# Patient Record
Sex: Female | Born: 1990 | Race: White | Hispanic: No | Marital: Single
Health system: Southern US, Community
[De-identification: ages and names within clinical notes are randomized; demographics above are authoritative.]

---

## 2019-07-06 ENCOUNTER — Other Ambulatory Visit: Payer: Self-pay

## 2019-07-06 DIAGNOSIS — Z20822 Contact with and (suspected) exposure to covid-19: Secondary | ICD-10-CM

## 2019-07-08 LAB — NOVEL CORONAVIRUS, NAA: SARS-CoV-2, NAA: NOT DETECTED

## 2019-07-14 ENCOUNTER — Emergency Department (HOSPITAL_COMMUNITY)
Admission: EM | Admit: 2019-07-14 | Discharge: 2019-07-14 | Disposition: A | Discharge status: Home / Self Care | Payer: Managed Care, Other (non HMO) | Attending: Emergency Medicine | Admitting: Emergency Medicine

## 2019-07-14 ENCOUNTER — Emergency Department (HOSPITAL_COMMUNITY): Payer: Managed Care, Other (non HMO)

## 2019-07-14 ENCOUNTER — Other Ambulatory Visit: Payer: Self-pay

## 2019-07-14 ENCOUNTER — Encounter (HOSPITAL_COMMUNITY): Payer: Self-pay | Admitting: Emergency Medicine

## 2019-07-14 DIAGNOSIS — R109 Unspecified abdominal pain: Secondary | ICD-10-CM

## 2019-07-14 DIAGNOSIS — R0781 Pleurodynia: Secondary | ICD-10-CM

## 2019-07-14 DIAGNOSIS — Y9389 Activity, other specified: Secondary | ICD-10-CM | POA: Insufficient documentation

## 2019-07-14 DIAGNOSIS — S20411A Abrasion of right back wall of thorax, initial encounter: Secondary | ICD-10-CM | POA: Insufficient documentation

## 2019-07-14 DIAGNOSIS — R1031 Right lower quadrant pain: Secondary | ICD-10-CM | POA: Insufficient documentation

## 2019-07-14 DIAGNOSIS — W01198A Fall on same level from slipping, tripping and stumbling with subsequent striking against other object, initial encounter: Secondary | ICD-10-CM | POA: Insufficient documentation

## 2019-07-14 DIAGNOSIS — Y929 Unspecified place or not applicable: Secondary | ICD-10-CM | POA: Insufficient documentation

## 2019-07-14 DIAGNOSIS — Y998 Other external cause status: Secondary | ICD-10-CM | POA: Diagnosis not present

## 2019-07-14 DIAGNOSIS — S299XXA Unspecified injury of thorax, initial encounter: Secondary | ICD-10-CM | POA: Diagnosis present

## 2019-07-14 DIAGNOSIS — T148XXA Other injury of unspecified body region, initial encounter: Secondary | ICD-10-CM

## 2019-07-14 LAB — URINALYSIS, ROUTINE W REFLEX MICROSCOPIC
Bilirubin Urine: NEGATIVE
Glucose, UA: NEGATIVE mg/dL
Ketones, ur: NEGATIVE mg/dL
Leukocytes,Ua: NEGATIVE
Nitrite: NEGATIVE
Protein, ur: 30 mg/dL — AB
Specific Gravity, Urine: 1.023 (ref 1.005–1.030)
pH: 6 (ref 5.0–8.0)

## 2019-07-14 MED ORDER — METHOCARBAMOL 500 MG PO TABS: 500.0000 mg | ORAL_TABLET | Freq: Two times a day (BID) | ORAL | 0 refills | Status: AC

## 2019-07-14 MED ORDER — IBUPROFEN 400 MG PO TABS
600.0000 mg | ORAL_TABLET | Freq: Once | ORAL | Status: AC
  Administered 2019-07-14: 600 mg via ORAL
  Filled 2019-07-14: qty 1

## 2019-07-14 NOTE — Discharge Instructions (Signed)
Your x-rays are reassuring and show no rib fractures. Take ibuprofen and tylenol every 6 hours, apply ice for the next 2 days, and then alternate ice and heat. Take muscle relaxer as needed, this can cause drowsiness.  Keep abrasion clean and covered.  If pain is worsening or you develop blood in your urine, shortness of breath or any other new or concerning symptoms please return to the ED for reevaluation.

## 2019-07-14 NOTE — ED Provider Notes (Signed)
[facility] Provider Note   CSN: [id number] Arrival date & time: 07/14/19  1928     History   Chief Complaint Chief Complaint  Patient presents with  . Rib Injury    HPI Cynthia Erickson is a 28 y.o. female.     Cynthia Erickson is a 28 y.o. female who is otherwise healthy, presents for evaluation of right great toe injury.  She reports that her dog ran up behind her and jumped onto her legs causing her to fall onto her right side landing on a concrete step.  She has a large abrasion to the right lower lateral ribs and complains and reports pain that is worse with movement and deep breaths.  She has not had anything for pain prior to arrival.  She denies associated shortness of breath.  Has not noted any hematuria.  No anterior abdominal pain.  She did not hit her head, no neck or back pain.  No other injuries from fall.  Tetanus up-to-date.  No other aggravating or alleviating factors.     History reviewed. No pertinent past medical history.  There are no active problems to display for this patient.   History reviewed. No pertinent surgical history.   OB History   No obstetric history on file.      Home Medications    Prior to Admission medications   Not on File    Family History No family history on file.  Social History Social History   Tobacco Use  . Smoking status: Not on file  Substance Use Topics  . Alcohol use: Not on file  . Drug use: Not on file     Allergies   Sulfa antibiotics and Sulfur   Review of Systems Review of Systems  Constitutional: Negative for chills and fever.  HENT: Negative.   Respiratory: Negative for cough and shortness of breath.   Cardiovascular: Positive for chest pain (R lateral rib pain).  Gastrointestinal: Negative for abdominal pain, nausea and vomiting.  Genitourinary: Positive for flank pain. Negative for dysuria and hematuria.  Musculoskeletal: Positive for back pain.  Skin:  Positive for wound. Negative for color change.  Neurological: Negative for dizziness, syncope and light-headedness.     Physical Exam Updated Vital Signs BP (!) 133/92 (BP Location: Left Arm)   Pulse 83   Temp 98.1 F (36.7 C) (Oral)   Resp 14   LMP 07/14/2019 (Exact Date)   SpO2 97%   Physical Exam Vitals signs and nursing note reviewed.  Constitutional:      General: She is not in acute distress.    Appearance: Normal appearance. She is well-developed and normal weight. She is not diaphoretic.  HENT:     Head: Normocephalic and atraumatic.     Comments: No scalp tenderness or deformity Eyes:     General:        Right eye: No discharge.        Left eye: No discharge.     Pupils: Pupils are equal, round, and reactive to light.  Neck:     Musculoskeletal: Neck supple.  Cardiovascular:     Rate and Rhythm: Normal rate and regular rhythm.     Heart sounds: Normal heart sounds. No murmur. No friction rub. No gallop.   Pulmonary:     Effort: Pulmonary effort is normal. No respiratory distress.     Breath sounds: Normal breath sounds. No wheezing or rales.     Comments: Tenderness over the right lateral  ribs with large superficial abrasion to the right posterior lateral ribs, no palpable deformity or crepitus, breath sounds present throughout, lungs clear to auscultation Chest:     Chest wall: Tenderness present.  Abdominal:     General: Bowel sounds are normal. There is no distension.     Palpations: Abdomen is soft. There is no mass.     Tenderness: There is no abdominal tenderness. There is no guarding.     Comments: Abdomen soft and nondistended with no anterior abdominal tenderness, no focal right upper quadrant tenderness, patient does have some tenderness over the right flank but with no ecchymosis or deformity, and palpable overlying muscle spasm.  Musculoskeletal:        General: No deformity.     Comments: Tenderness over the right mid and low back musculature, no  midline spinal tenderness, no palpable deformity All joints supple and easily movable, although compartments soft.  Skin:    General: Skin is warm and dry.     Capillary Refill: Capillary refill takes less than 2 seconds.  Neurological:     Mental Status: She is alert.     Coordination: Coordination normal.     Comments: Speech is clear, able to follow commands Moves extremities without ataxia, coordination intact  Psychiatric:        Mood and Affect: Mood normal.        Behavior: Behavior normal.      ED Treatments / Results  Labs (all labs ordered are listed, but only abnormal results are displayed) Labs Reviewed - No data to display  EKG None  Radiology Dg Ribs Unilateral W/chest Right  Result Date: 07/14/2019 CLINICAL DATA:  Posterior lower RIGHT rib pain after falling today onto concrete EXAM: RIGHT RIBS AND CHEST - 3+ VIEW COMPARISON:  None FINDINGS: Normal heart size, mediastinal contours, and pulmonary vascularity. Lungs clear. No pulmonary infiltrate, pleural effusion, or pneumothorax. LEFT nipple shadow. BB placed at site of symptoms lower RIGHT chest. Osseous mineralization normal. No rib fracture or bone destruction. IMPRESSION: Normal exam. Electronically Signed   By: Ulyses SouthwardMark  Boles M.D.   On: 07/14/2019 20:26    Procedures Procedures (including critical care time)  Medications Ordered in ED Medications - No data to display   Initial Impression / Assessment and Plan / ED Course  I have reviewed the triage vital signs and the nursing notes.  Pertinent labs & imaging results that were available during my care of the patient were reviewed by me and considered in my medical decision making (see chart for details).  Patient presents for evaluation of pain over the right lateral ribs and flank after her dog jumped on her causing her to fall into a concrete step.  She has a large abrasion from the concrete step, this is superficial and will not require suture repair and  tetanus is up-to-date, wound was cleaned and dressed.  Lungs clear with breath sounds present throughout not suggestive of pneumothorax and no palpable deformity over the ribs, x-ray shows no evidence of rib fracture or other acute findings.  Patient does have some tenderness over the flank and back musculature but I suspect this is superficial muscle tenderness.  I did check urinalysis and this showed slight hematuria but patient reported that she is currently on her menstrual cycle.  She is hemodynamically stable and pain significantly improved with NSAIDs, had shared decision making conversation with patient and she would prefer to monitor her symptoms closely at home rather than proceeding with labs and  CT scan, especially given her improvement in pain.  I feel this plan is very reasonable.  Discussed strict return precautions with the patient.  She expresses understanding and agreement with plan.  Discussed appropriate wound care.  Discharged home in good condition.  Final Clinical Impressions(s) / ED Diagnoses   Final diagnoses:  Rib pain on right side  Right flank pain  Abrasion    ED Discharge Orders         Ordered    methocarbamol (ROBAXIN) 500 MG tablet  2 times daily     07/14/19 2207           Dartha LodgeFord, Lazlo Tunney N, PA-C 07/16/19 0100    Pricilla LovelessGoldston, Scott, MD 07/19/19 [phone]

## 2019-07-14 NOTE — ED Triage Notes (Addendum)
Pt here for eval of right sided rib injury, states her dog ran up behind her and into her legs, she fell to her right side on the concrete stair. Large abrasion noted to right lower rib cage with bleeding controlled. Pt has pain with movement, guarding ribs.

## 2019-07-14 NOTE — ED Notes (Signed)
Discharge instructions discussed with pt. Pt verbalized understanding. Pt states no questions at this time.

## 2019-07-14 NOTE — ED Notes (Signed)
Patient transported to X-ray 

## 2019-07-31 ENCOUNTER — Other Ambulatory Visit: Payer: Self-pay

## 2019-07-31 DIAGNOSIS — Z20822 Contact with and (suspected) exposure to covid-19: Secondary | ICD-10-CM

## 2019-08-01 LAB — NOVEL CORONAVIRUS, NAA: SARS-CoV-2, NAA: NOT DETECTED

## 2021-05-26 IMAGING — DX RIGHT RIBS AND CHEST - 3+ VIEW
5 series · 5 of 5 positions shown · non-contrast
Comparison: None

CLINICAL DATA: Posterior lower RIGHT rib pain after falling today
onto concrete

EXAM:
RIGHT RIBS AND CHEST - 3+ VIEW

[chest pa]
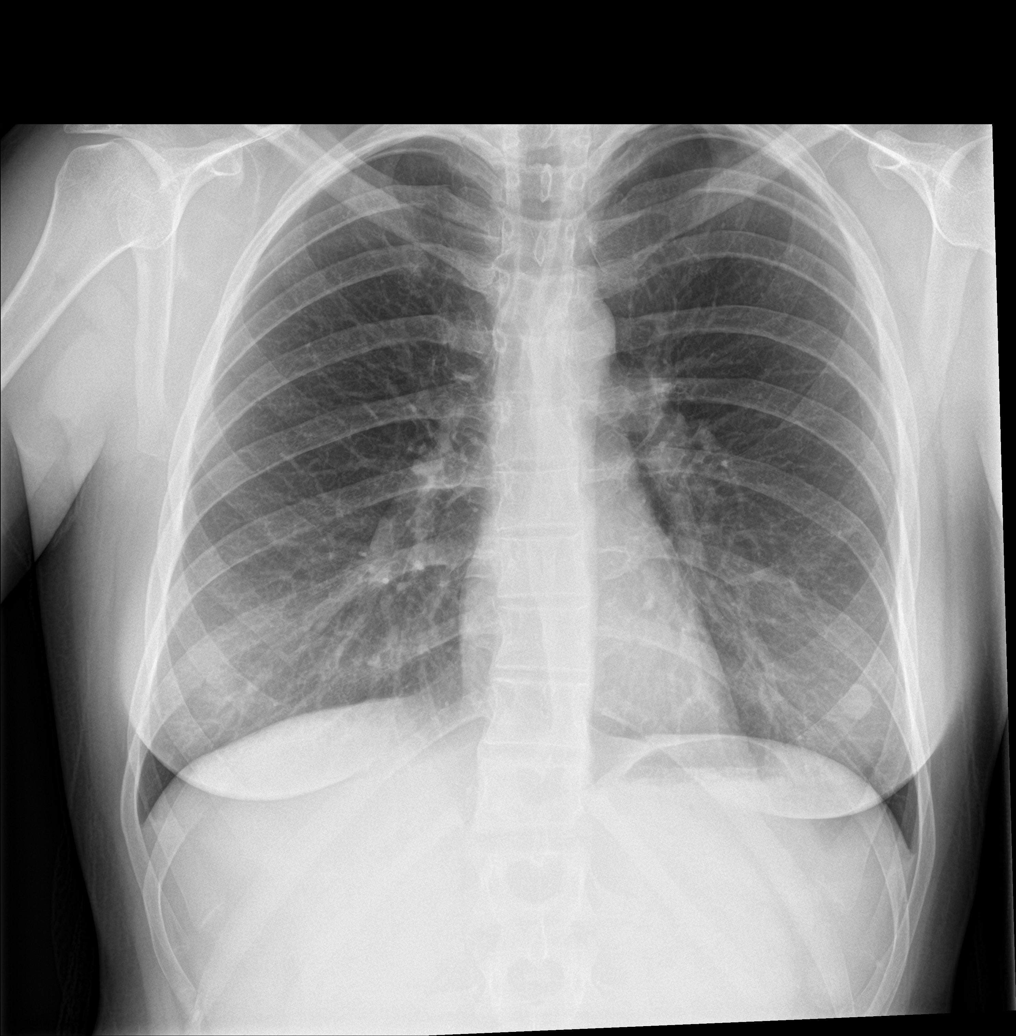

[rib ap (1 of 2)]
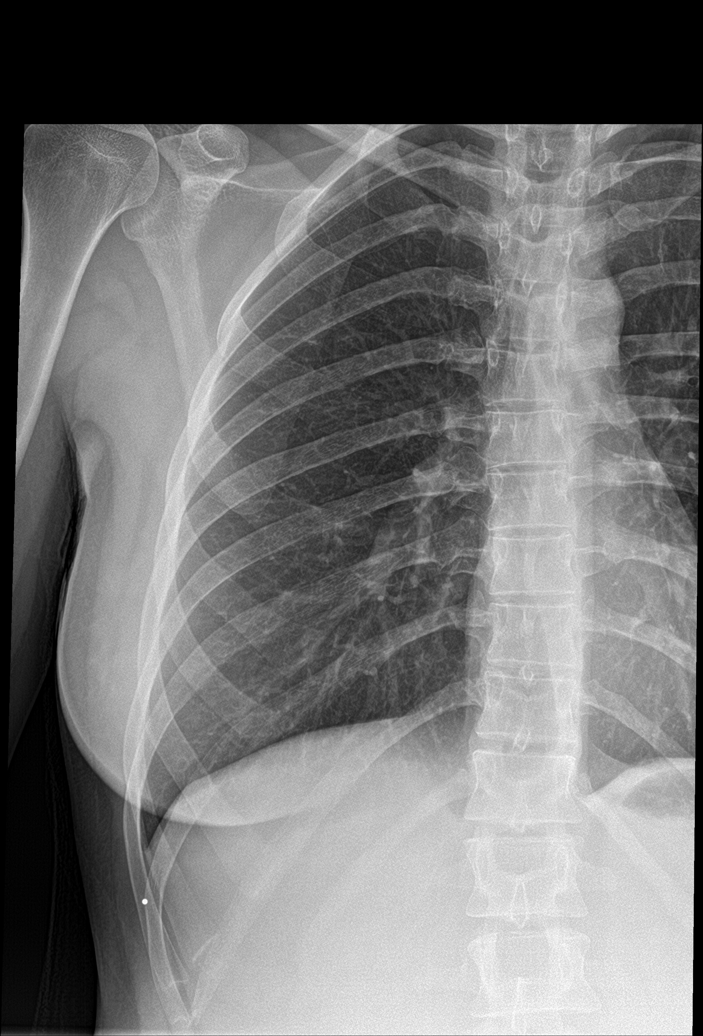

[rib ap (2 of 2)]
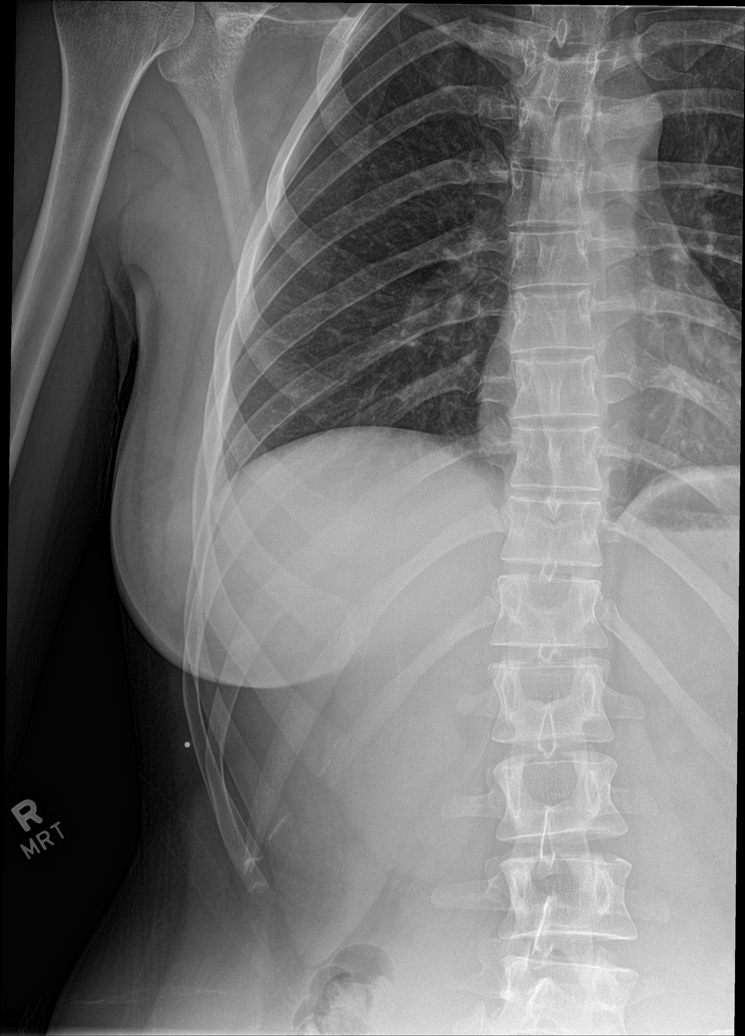

[rib ap obl (1 of 2)]
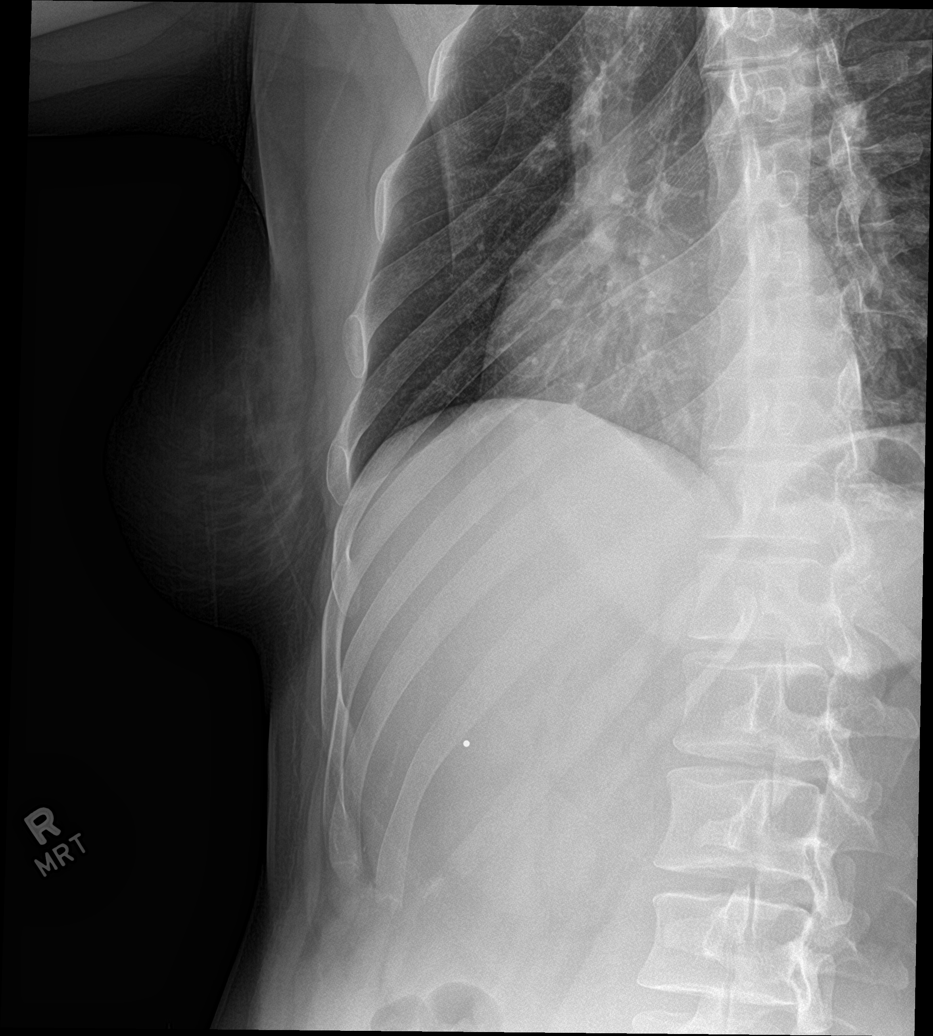

[rib ap obl (2 of 2)]
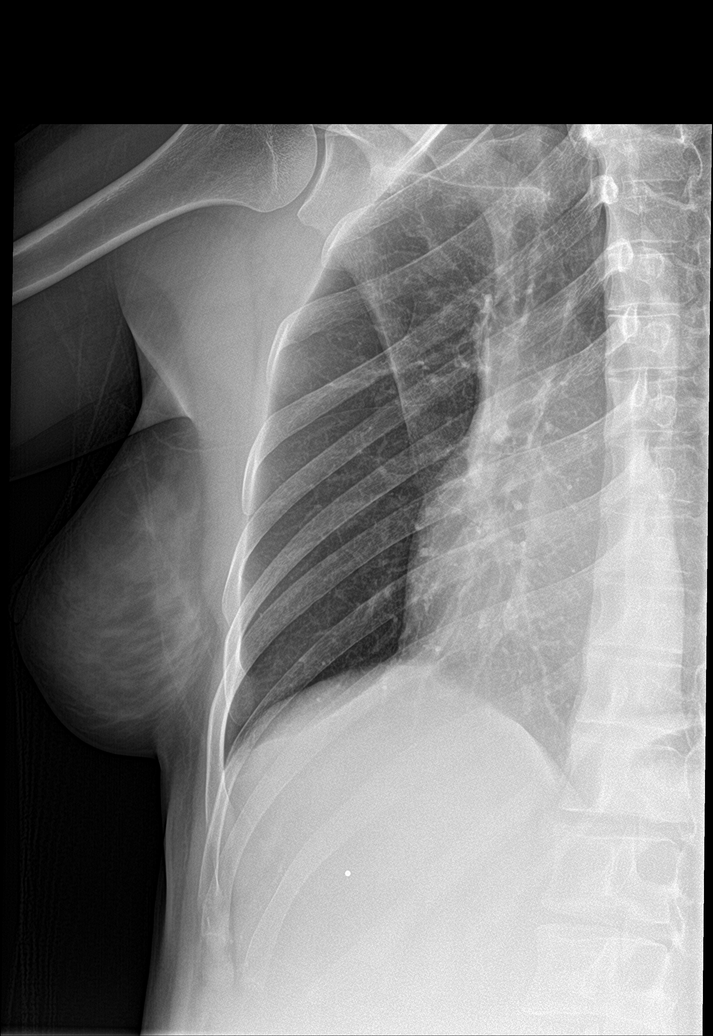

[5 of 5 positions shown; findings below may reference images not displayed]

FINDINGS: Normal heart size, mediastinal contours, and pulmonary vascularity.

Lungs clear.

No pulmonary infiltrate, pleural effusion, or pneumothorax.

LEFT nipple shadow.

BB placed at site of symptoms lower RIGHT chest.

Osseous mineralization normal.

No rib fracture or bone destruction.
IMPRESSION: Normal exam.

## 2021-06-21 ENCOUNTER — Inpatient Hospital Stay
Admit: 2021-06-21 | Discharge: 2021-06-21 | Disposition: A | Discharge status: Home / Self Care | Payer: MEDICAID | Attending: Emergency Medicine

## 2021-06-21 DIAGNOSIS — F411 Generalized anxiety disorder: Secondary | ICD-10-CM

## 2021-06-21 LAB — COMPREHENSIVE METABOLIC PANEL
ALT: 18 U/L (ref 10–35)
AST: 19 U/L (ref 10–35)
Albumin/Globulin Ratio: 1.8 (ref 1.1–2.2)
Albumin: 4.9 g/dL (ref 3.5–5.2)
Alkaline Phosphatase: 67 U/L (ref 35–104)
Anion Gap: 13 mmol/L (ref 5–15)
BUN: 11 MG/DL (ref 6–20)
Bun/Cre Ratio: 17 (ref 12–20)
CO2: 27 mmol/L (ref 22–29)
Calcium: 9.6 MG/DL (ref 8.6–10.0)
Chloride: 100 mmol/L (ref 98–107)
Creatinine: 0.63 MG/DL (ref 0.50–0.90)
EGFR IF NonAfrican American: >60 mL/min/{1.73_m2} (ref >60)
GFR African American: >60 mL/min/{1.73_m2} (ref >60)
Globulin: 2.8 g/dL (ref 2.0–4.0)
Glucose: 94 mg/dL (ref 65–100)
Potassium: 3.9 mmol/L (ref 3.5–5.1)
Sodium: 140 mmol/L (ref 136–145)
Total Bilirubin: 0.3 MG/DL (ref 0.2–1.0)
Total Protein: 7.7 g/dL (ref 6.4–8.3)

## 2021-06-21 LAB — CBC WITH AUTO DIFFERENTIAL
Basophils %: 0 % (ref 0–1)
Basophils Absolute: 0.1 10*3/uL (ref 0.0–0.1)
Eosinophils %: 1 % (ref 0–7)
Eosinophils Absolute: 0.1 10*3/uL (ref 0.0–0.4)
Granulocyte Absolute Count: 0 10*3/uL (ref 0.00–0.04)
Hematocrit: 39.5 % (ref 35.0–47.0)
Hemoglobin: 13.7 g/dL (ref 11.5–16.0)
Immature Granulocytes: 0 % (ref 0.0–0.5)
Lymphocytes %: 23 % (ref 12–49)
Lymphocytes Absolute: 3 10*3/uL (ref 0.8–3.5)
MCH: 30.8 PG (ref 26.0–34.0)
MCHC: 34.7 g/dL (ref 30.0–36.5)
MCV: 88.8 FL (ref 80.0–99.0)
MPV: 8.6 FL — ABNORMAL LOW (ref 8.9–12.9)
Monocytes %: 5 % (ref 5–13)
Monocytes Absolute: 0.7 10*3/uL (ref 0.0–1.0)
NRBC Absolute: 0 10*3/uL (ref 0.00–0.01)
Neutrophils %: 70 % (ref 32–75)
Neutrophils Absolute: 9.1 10*3/uL — ABNORMAL HIGH (ref 1.8–8.0)
Nucleated RBCs: 0 PER 100 WBC
Platelets: 299 10*3/uL (ref 150–400)
RBC: 4.45 M/uL (ref 3.80–5.20)
RDW: 12.6 % (ref 11.5–14.5)
WBC: 12.9 10*3/uL — ABNORMAL HIGH (ref 3.6–11.0)

## 2021-06-21 LAB — POC TROPONIN-I
POC Troponin I: <0.04 ng/mL (ref 0.00–0.08)
Troponin-I (POC): <0.04 ng/mL (ref 0.00–0.08)

## 2021-06-21 LAB — CBC WITH AUTOMATED DIFF
ABS. BASOPHILS: 0.1 10*3/uL (ref 0.0–0.1)
ABS. EOSINOPHILS: 0.1 10*3/uL (ref 0.0–0.4)
ABS. IMM. GRANS.: 0 10*3/uL (ref 0.00–0.04)
ABS. LYMPHOCYTES: 3 10*3/uL (ref 0.8–3.5)
ABS. MONOCYTES: 0.7 10*3/uL (ref 0.0–1.0)
ABS. NEUTROPHILS: 9.1 10*3/uL — ABNORMAL HIGH (ref 1.8–8.0)
ABSOLUTE NRBC: 0 10*3/uL (ref 0.00–0.01)
BASOPHILS: 0 % (ref 0–1)
EOSINOPHILS: 1 % (ref 0–7)
HCT: 39.5 % (ref 35.0–47.0)
HGB: 13.7 g/dL (ref 11.5–16.0)
IMMATURE GRANULOCYTES: 0 % (ref 0.0–0.5)
LYMPHOCYTES: 23 % (ref 12–49)
MCH: 30.8 PG (ref 26.0–34.0)
MCHC: 34.7 g/dL (ref 30.0–36.5)
MCV: 88.8 FL (ref 80.0–99.0)
MONOCYTES: 5 % (ref 5–13)
MPV: 8.6 FL — ABNORMAL LOW (ref 8.9–12.9)
NEUTROPHILS: 70 % (ref 32–75)
NRBC: 0 PER 100 WBC
PLATELET: 299 10*3/uL (ref 150–400)
RBC: 4.45 M/uL (ref 3.80–5.20)
RDW: 12.6 % (ref 11.5–14.5)
WBC: 12.9 10*3/uL — ABNORMAL HIGH (ref 3.6–11.0)

## 2021-06-21 LAB — METABOLIC PANEL, COMPREHENSIVE
A-G Ratio: 1.8 (ref 1.1–2.2)
ALT (SGPT): 18 U/L (ref 10–35)
AST (SGOT): 19 U/L (ref 10–35)
Albumin: 4.9 g/dL (ref 3.5–5.2)
Alk. phosphatase: 67 U/L (ref 35–104)
Anion gap: 13 mmol/L (ref 5–15)
BUN/Creatinine ratio: 17 (ref 12–20)
BUN: 11 MG/DL (ref 6–20)
Bilirubin, total: 0.3 MG/DL (ref 0.2–1.0)
CO2: 27 mmol/L (ref 22–29)
Calcium: 9.6 MG/DL (ref 8.6–10.0)
Chloride: 100 mmol/L (ref 98–107)
Creatinine: 0.63 MG/DL (ref 0.50–0.90)
GFR est AA: >60 mL/min/{1.73_m2} (ref >60)
GFR est non-AA: >60 mL/min/{1.73_m2} (ref >60)
Globulin: 2.8 g/dL (ref 2.0–4.0)
Glucose: 94 mg/dL (ref 65–100)
Potassium: 3.9 mmol/L (ref 3.5–5.1)
Protein, total: 7.7 g/dL (ref 6.4–8.3)
Sodium: 140 mmol/L (ref 136–145)

## 2021-06-21 MED ORDER — LORAZEPAM 1 MG TAB
1 mg | Freq: Once | ORAL | Status: AC
Start: 2021-06-21 — End: 2021-06-21
  Administered 2021-06-21: 21:00:00 via ORAL

## 2021-06-21 MED FILL — LORAZEPAM 1 MG TAB: 1 mg | ORAL | Qty: 1

## 2021-06-21 NOTE — ED Notes (Signed)
Pt arrives to ER with c/o chest pain and dizziness that started about an hour ago. Pt reports hx of anxiety.

## 2021-06-21 NOTE — ED Notes (Signed)
Patient does not appear to be in any acute distress/shows no evidence of clinical instability at this time.     Provider has reviewed discharge instructions with the patient/family.  The patient/family verbalized understanding instructions as well as need for follow up for any further symptoms.     Discharge papers given, education provided, and any questions answered. Patient discharged by provider.

## 2021-06-21 NOTE — ED Provider Notes (Signed)
Patient is here because of anxiety.  She states that earlier today she drank a Hospital doctor energy drink and started feel anxious.  She started having palpitations and feeling short of breath and diaphoretic and nausea.  She states that she has had anxiety reactions like this before.  She is supposed to be taking Prozac but she has not taken Prozac in 1.5 weeks.  She denies any alcohol tobacco or illegal drugs.  She states that she is starting to feel better.  She has been in the ED several times for these type episodes in the past.  She denies any thoughts of hurting her self.  Currently has no complaints and states that if she felt this good earlier she would not have come to the ED.           Past Medical History:   Diagnosis Date   ??? Anxiety        History reviewed. No pertinent surgical history.      History reviewed. No pertinent family history.    Social History     Socioeconomic History   ??? Marital status: SINGLE     Spouse name: Not on file   ??? Number of children: Not on file   ??? Years of education: Not on file   ??? Highest education level: Not on file   Occupational History   ??? Not on file   Tobacco Use   ??? Smoking status: Never Smoker   ??? Smokeless tobacco: Never Used   Substance and Sexual Activity   ??? Alcohol use: Yes     Comment: socially   ??? Drug use: Yes     Types: Marijuana   ??? Sexual activity: Not on file   Other Topics Concern   ??? Not on file   Social History Narrative   ??? Not on file     Social Determinants of Health     Financial Resource Strain:    ??? Difficulty of Paying Living Expenses: Not on file   Food Insecurity:    ??? Worried About Running Out of Food in the Last Year: Not on file   ??? Ran Out of Food in the Last Year: Not on file   Transportation Needs:    ??? Lack of Transportation (Medical): Not on file   ??? Lack of Transportation (Non-Medical): Not on file   Physical Activity:    ??? Days of Exercise per Week: Not on file   ??? Minutes of Exercise per Session: Not on file   Stress:    ??? Feeling of  Stress : Not on file   Social Connections:    ??? Frequency of Communication with Friends and Family: Not on file   ??? Frequency of Social Gatherings with Friends and Family: Not on file   ??? Attends Religious Services: Not on file   ??? Active Member of Clubs or Organizations: Not on file   ??? Attends Archivist Meetings: Not on file   ??? Marital Status: Not on file   Intimate Partner Violence:    ??? Fear of Current or Ex-Partner: Not on file   ??? Emotionally Abused: Not on file   ??? Physically Abused: Not on file   ??? Sexually Abused: Not on file   Housing Stability:    ??? Unable to Pay for Housing in the Last Year: Not on file   ??? Number of Places Lived in the Last Year: Not on file   ??? Unstable Housing in the  Last Year: Not on file         ALLERGIES: Sulfa (sulfonamide antibiotics)    Review of Systems   Constitutional: Negative for chills, diaphoresis, fatigue and fever.   HENT: Negative for congestion, rhinorrhea and sore throat.    Eyes: Negative for pain and discharge.   Respiratory: Negative for cough, shortness of breath and wheezing.    Cardiovascular: Positive for chest pain and palpitations. Negative for leg swelling.   Gastrointestinal: Positive for nausea. Negative for abdominal pain, constipation, diarrhea and vomiting.   Genitourinary: Negative for dysuria, flank pain and hematuria.   Musculoskeletal: Negative for arthralgias, back pain, gait problem, joint swelling, myalgias, neck pain and neck stiffness.   Skin: Negative for color change, pallor, rash and wound.   Neurological: Positive for dizziness and light-headedness. Negative for tremors, syncope, speech difficulty, numbness and headaches.       Vitals:    06/21/21 1629 06/21/21 1635   BP: 134/89    Pulse: 99    Resp: 16    Temp: 98.8 ??F (37.1 ??C)    SpO2: 100% 100%   Weight: 72.6 kg (160 lb)    Height: '5\' 8"'  (1.727 m)             Physical Exam  Vitals and nursing note reviewed.   Constitutional:       General: She is not in acute distress.      Appearance: She is well-developed and normal weight. She is not ill-appearing, toxic-appearing or diaphoretic.   HENT:      Head: Normocephalic and atraumatic.   Eyes:      Extraocular Movements: Extraocular movements intact.      Pupils: Pupils are equal, round, and reactive to light.      Comments: Pupils 3 mm and reactive and round and reactive to light bilaterally.  No proptosis.   Cardiovascular:      Rate and Rhythm: Normal rate and regular rhythm.      Heart sounds: Normal heart sounds.   Pulmonary:      Effort: Pulmonary effort is normal.      Breath sounds: Normal breath sounds. No decreased breath sounds, wheezing, rhonchi or rales.   Chest:      Chest wall: No mass, deformity, tenderness, crepitus or edema. There is no dullness to percussion.   Abdominal:      General: Bowel sounds are normal.      Palpations: Abdomen is soft. There is no mass.      Tenderness: There is no abdominal tenderness. There is no guarding or rebound.   Musculoskeletal:         General: Normal range of motion.      Cervical back: Normal range of motion and neck supple.      Right lower leg: No tenderness. No edema.      Left lower leg: No tenderness. No edema.   Skin:     General: Skin is warm and dry.      Capillary Refill: Capillary refill takes less than 2 seconds.      Coloration: Skin is not cyanotic or pale.      Findings: No ecchymosis, erythema or rash.      Nails: There is no clubbing.   Neurological:      General: No focal deficit present.      Mental Status: She is alert and oriented to person, place, and time.   Psychiatric:         Mood and Affect:  Mood is anxious.          MDM  ED Course as of 06/21/21 1746   Sun Jun 21, 3234   5732 METABOLIC PANEL,COMP.:    Sodium 140   Potassium 3.9   Chloride 100   CO2 27   Anion gap 13   Glucose 94   BUN 11   Creatinine 0.63   BUN/Creatinine ratio 17   GFR est AA >60   GFR est non-AA >60   Calcium 9.6   Bilirubin, total 0.3   ALT 18   AST 19   Alk. phosphatase 67   Protein,  total 7.7   Albumin 4.9   Globulin 2.8   A-G Ratio 1.8 [AF]   1743 CBC WITH AUTOMATED DIFF(!):    WBC 12.9(!)   RBC 4.45   HGB 13.7   HCT 39.5   MCV 88.8   MCH 30.8   MCHC 34.7   RDW 12.6   PLATELET 299   MPV 8.6(!)   NRBC 0.0   ABSOLUTE NRBC 0.00   NEUTROPHILS 70   LYMPHOCYTES 23   MONOCYTES 5   EOSINOPHILS 1   BASOPHILS 0   IMMATURE GRANULOCYTES 0   ABS. NEUTROPHILS 9.1(!)   ABS. LYMPHOCYTES 3.0   ABS. MONOCYTES 0.7   ABS. EOSINOPHILS 0.1   ABS. BASOPHILS 0.1   ABS. IMM. GRANS. 0.0   DF AUTOMATED [AF]   2025 Examined.  Patient in no acute distress.  Feels better.  States that she has a history anxiety reactions and this feels exactly previous anxiety reactions.  Labs here been reassuring.  EKG shows normal sinus rhythm, rate 97, normal PR interval, normal QRS, nonspecific ST changes, normal QTC.  She has not been taking her Prozac.  Instructed patient to start taking her Prozac.  Avoid caffeinated beverages.  While patient follow-up with her primary care doctor.  Patient discharged home in the care of her mother who appears liable and concern for patient's wellbeing.  I did offer a urine pregnancy test but she plainly refused.  Patient denies any self-harm or thoughts of hurting her self. [AF]      ED Course User Index  [AF] Lorelee Market, DO       Procedures

## 2021-06-23 LAB — EKG 12-LEAD
Atrial Rate: 97 {beats}/min
P Axis: 18 degrees
P-R Interval: 132 ms
Q-T Interval: 366 ms
QRS Duration: 78 ms
QTc Calculation (Bazett): 464 ms
R Axis: 66 degrees
T Axis: 29 degrees
Ventricular Rate: 97 {beats}/min

## 2021-06-23 LAB — EKG, 12 LEAD, INITIAL
Atrial Rate: 97 {beats}/min
Calculated P Axis: 18 degrees
Calculated R Axis: 66 degrees
Calculated T Axis: 29 degrees
P-R Interval: 132 ms
Q-T Interval: 366 ms
QRS Duration: 78 ms
QTC Calculation (Bezet): 464 ms
Ventricular Rate: 97 {beats}/min

## 2021-11-13 ENCOUNTER — Emergency Department: Admit: 2021-11-13 | Payer: MEDICAID | Primary: Family Medicine

## 2021-11-13 ENCOUNTER — Inpatient Hospital Stay
Admit: 2021-11-13 | Discharge: 2021-11-13 | Disposition: A | Discharge status: Home / Self Care | Payer: MEDICAID | Attending: Student in an Organized Health Care Education/Training Program

## 2021-11-13 DIAGNOSIS — S0990XA Unspecified injury of head, initial encounter: Secondary | ICD-10-CM

## 2021-11-13 MED ORDER — IBUPROFEN 800 MG TAB
800 mg | ORAL | Status: DC
Start: 2021-11-13 — End: 2021-11-13

## 2021-11-13 MED ORDER — CYCLOBENZAPRINE 10 MG TAB
10 mg | ORAL_TABLET | Freq: Three times a day (TID) | ORAL | 0 refills | Status: AC | PRN
Start: 2021-11-13 — End: ?

## 2021-11-13 MED ORDER — IBUPROFEN 800 MG TAB
800 mg | ORAL_TABLET | Freq: Four times a day (QID) | ORAL | 0 refills | Status: AC | PRN
Start: 2021-11-13 — End: 2021-11-20
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
## 2021-11-13 NOTE — ED Notes (Signed)
Patient does not appear to be in any acute distress/shows no evidence of clinical instability at this time.     Provider has reviewed discharge instructions with the patient/family.  The patient/family verbalized understanding instructions as well as need for follow up for any further symptoms.     Discharge papers given, education provided, and any questions answered.
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
